# Patient Record
Sex: Male | Born: 1985 | Race: White | Hispanic: No | Marital: Single | Smoking: Current every day smoker
Health system: Southern US, Community
[De-identification: ages and names within clinical notes are randomized; demographics above are authoritative.]

## PROBLEM LIST (undated history)

## (undated) HISTORY — PX: NECK SURGERY: SHX720

## (undated) HISTORY — PX: LEG SURGERY: SHX1003

---

## 2012-02-15 ENCOUNTER — Encounter (HOSPITAL_COMMUNITY): Payer: Self-pay | Admitting: Emergency Medicine

## 2012-02-15 ENCOUNTER — Emergency Department (HOSPITAL_COMMUNITY)
Admission: EM | Admit: 2012-02-15 | Discharge: 2012-02-15 | Disposition: A | Discharge status: Home / Self Care | Payer: Self-pay | Attending: Emergency Medicine | Admitting: Emergency Medicine

## 2012-02-15 DIAGNOSIS — M25571 Pain in right ankle and joints of right foot: Secondary | ICD-10-CM

## 2012-02-15 DIAGNOSIS — M25579 Pain in unspecified ankle and joints of unspecified foot: Secondary | ICD-10-CM | POA: Insufficient documentation

## 2012-02-15 DIAGNOSIS — X500XXA Overexertion from strenuous movement or load, initial encounter: Secondary | ICD-10-CM | POA: Insufficient documentation

## 2012-02-15 MED ORDER — IBUPROFEN 800 MG PO TABS: 800.0000 mg | ORAL_TABLET | Freq: Three times a day (TID) | ORAL | Status: AC

## 2012-02-15 NOTE — ED Notes (Signed)
Pt refused xray, reports its too expensive.

## 2012-02-15 NOTE — ED Notes (Addendum)
Pt c/o right ankle pain since yesterday morning. Pt reports he hurt it while he was working, stepped over a hose and landed on it funny. Pt reports the pain in worse when he puts pressure on it. Pt reports he doesn't want an xray unless is necessary because its too expensive.

## 2012-02-18 NOTE — ED Provider Notes (Signed)
Medical screening examination/treatment/procedure(s) were performed by non-physician practitioner and as supervising physician I was immediately available for consultation/collaboration.   Megham Dwyer B. Bernette Mayers, MD 02/18/12 2149

## 2012-02-18 NOTE — ED Provider Notes (Signed)
History     CSN: [id number]  Arrival date & time 02/15/12  2023   First MD Initiated Contact with Patient 02/15/12 2032      Chief Complaint  Patient presents with  . Ankle Pain    (Consider location/radiation/quality/duration/timing/severity/associated sxs/prior treatment) HPI Hx from pt. Jeremy Avery is a 26 y.o. male  Presenting with right ankle pain. He states that yesterday morning, he stepped over a hose and "landed on it funny." States pain is worse with weightbearing although he is able to weight-bear. He does have a history of ORIF of the leg due to an ankle fracture previously. He states this was many years ago. Pain is located primarily to the medial aspect of the ankle and does not radiate. He has not noticed any swelling or deformity to the area. He denies any bruising, numbness, or weakness.  History reviewed. No pertinent past medical history.  History reviewed. No pertinent past surgical history.  No family history on file.  History  Substance Use Topics  . Smoking status: Not on file  . Smokeless tobacco: Not on file  . Alcohol Use: Not on file      Review of Systems as per history of present illness  Allergies  Review of patient's allergies indicates no known allergies.  Home Medications   Current Outpatient Rx  Name Route Sig Dispense Refill  . IBUPROFEN 800 MG PO TABS Oral Take 1 tablet (800 mg total) by mouth 3 (three) times daily. 21 tablet 0    BP 139/79  Pulse 109  Temp 98.2 F (36.8 C) (Oral)  Resp 18  SpO2 95%  Physical Exam  Nursing note and vitals reviewed. Constitutional: He appears well-developed and well-nourished. No distress.  HENT:  Head: Normocephalic and atraumatic.  Neck: Normal range of motion.  Cardiovascular: Normal rate.   Pulmonary/Chest: Effort normal.  Musculoskeletal: Normal range of motion.       Right ankle: Tender to palpation just anterior to the medial malleolus. Mild amount of edema to this area. There  is no tenderness at the malleolus itself. No tenderness over the lateral aspect. There is no focal tenderness at the head of the fifth metatarsal. No crepitus or deformity appreciated. No swelling noted. Well-healed surgical scars noted consistent with previous ORIF.  Neurological: He is alert.  Skin: Skin is warm and dry. He is not diaphoretic.  Psychiatric: He has a normal mood and affect.    ED Course  Procedures (including critical care time)  Labs Reviewed - No data to display No results found.   1. Right ankle pain       MDM  Patient presents with right ankle pain. He declines x-rays. Patient has tenderness just anterior to the medial malleolus. No swelling or deformity noted. Likely sprain. Patient instructed on RICE. Reasons to return discussed.       Grant Fontana, PA-C 02/18/12 1315

## 2015-09-09 ENCOUNTER — Emergency Department (HOSPITAL_COMMUNITY): Payer: Self-pay

## 2015-09-09 ENCOUNTER — Encounter (HOSPITAL_COMMUNITY): Payer: Self-pay | Admitting: Emergency Medicine

## 2015-09-09 ENCOUNTER — Emergency Department (HOSPITAL_COMMUNITY)
Admission: EM | Admit: 2015-09-09 | Discharge: 2015-09-09 | Disposition: A | Discharge status: Home / Self Care | Payer: Self-pay | Attending: Emergency Medicine | Admitting: Emergency Medicine

## 2015-09-09 DIAGNOSIS — K2100 Gastro-esophageal reflux disease with esophagitis, without bleeding: Secondary | ICD-10-CM

## 2015-09-09 DIAGNOSIS — K209 Esophagitis, unspecified without bleeding: Secondary | ICD-10-CM

## 2015-09-09 DIAGNOSIS — F17200 Nicotine dependence, unspecified, uncomplicated: Secondary | ICD-10-CM | POA: Insufficient documentation

## 2015-09-09 DIAGNOSIS — Z7982 Long term (current) use of aspirin: Secondary | ICD-10-CM | POA: Insufficient documentation

## 2015-09-09 LAB — CBC
HCT: 45.1 % (ref 39.0–52.0)
HEMOGLOBIN: 15.5 g/dL (ref 13.0–17.0)
MCH: 28.8 pg (ref 26.0–34.0)
MCHC: 34.4 g/dL (ref 30.0–36.0)
MCV: 83.7 fL (ref 78.0–100.0)
Platelets: 161 10*3/uL (ref 150–400)
RBC: 5.39 MIL/uL (ref 4.22–5.81)
RDW: 12.7 % (ref 11.5–15.5)
WBC: 9.2 10*3/uL (ref 4.0–10.5)

## 2015-09-09 LAB — URINALYSIS, ROUTINE W REFLEX MICROSCOPIC
Bilirubin Urine: NEGATIVE
Glucose, UA: NEGATIVE mg/dL
Hgb urine dipstick: NEGATIVE
KETONES UR: NEGATIVE mg/dL
LEUKOCYTES UA: NEGATIVE
NITRITE: NEGATIVE
PH: 6 (ref 5.0–8.0)
Protein, ur: NEGATIVE mg/dL
SPECIFIC GRAVITY, URINE: 1.03 (ref 1.005–1.030)

## 2015-09-09 LAB — BASIC METABOLIC PANEL
ANION GAP: 12 (ref 5–15)
BUN: 13 mg/dL (ref 6–20)
CHLORIDE: 104 mmol/L (ref 101–111)
CO2: 21 mmol/L — AB (ref 22–32)
Calcium: 8.9 mg/dL (ref 8.9–10.3)
Creatinine, Ser: 0.86 mg/dL (ref 0.61–1.24)
GFR calc non Af Amer: >60 mL/min (ref >60)
Glucose, Bld: 115 mg/dL — ABNORMAL HIGH (ref 65–99)
POTASSIUM: 3.7 mmol/L (ref 3.5–5.1)
Sodium: 137 mmol/L (ref 135–145)

## 2015-09-09 LAB — I-STAT TROPONIN, ED: Troponin i, poc: 0 ng/mL (ref 0.00–0.08)

## 2015-09-09 MED ORDER — SUCRALFATE 1 G PO TABS: 1.0000 g | ORAL_TABLET | Freq: Four times a day (QID) | ORAL | Status: AC

## 2015-09-09 MED ORDER — GI COCKTAIL ~~LOC~~
30.0000 mL | Freq: Once | ORAL | Status: AC
  Administered 2015-09-09: 30 mL via ORAL
  Filled 2015-09-09: qty 30

## 2015-09-09 MED ORDER — OMEPRAZOLE 20 MG PO CPDR: 20.0000 mg | DELAYED_RELEASE_CAPSULE | Freq: Two times a day (BID) | ORAL | Status: AC

## 2015-09-09 NOTE — ED Provider Notes (Signed)
CSN: [id number]     Arrival date & time 09/09/15  [id number] History   First MD Initiated Contact with Patient 09/09/15 [phone]     Chief Complaint  Patient presents with  . Chest Pain      HPI  Patient presents evaluation of chest pain that started Sunday night, 3 days ago.  He states that he is changed his diet about 2 weeks ago. Started eating just once a day, but whenever I want". He states he worked all day Sunday. And his girlfriend bought pizza. He ate 6 pieces of pizza and 2 Cokes. Proximally hour later he laid down he started feeling burning and pressure in his chest. This persisted. He got up and walk during the night. This actually helped his pain. He has not had any exertional symptoms. He has no associated shortness of breath. He admits that it makes him somewhat apprehensive but no shortness of breath. No pleuritic discomfort. No nausea or vomiting. He been taking aspirin intermittently states he'll get some short-term relief with aspirin without aspirin seems to be making it worse as well.    History reviewed. No pertinent past medical history. Past Surgical History  Procedure Laterality Date  . Neck surgery    . Leg surgery     History reviewed. No pertinent family history. Social History  Substance Use Topics  . Smoking status: Current Every Day Smoker  . Smokeless tobacco: None  . Alcohol Use: No    Review of Systems  Constitutional: Negative for fever, chills, diaphoresis, appetite change and fatigue.  HENT: Negative for mouth sores, sore throat and trouble swallowing.   Eyes: Negative for visual disturbance.  Respiratory: Positive for chest tightness. Negative for cough, shortness of breath and wheezing.   Cardiovascular: Positive for chest pain.  Gastrointestinal: Negative for nausea, vomiting, abdominal pain, diarrhea and abdominal distention.  Endocrine: Negative for polydipsia, polyphagia and polyuria.  Genitourinary: Negative for dysuria, frequency and hematuria.   Musculoskeletal: Negative for gait problem.  Skin: Negative for color change, pallor and rash.  Neurological: Negative for dizziness, syncope, light-headedness and headaches.  Hematological: Does not bruise/bleed easily.  Psychiatric/Behavioral: Negative for behavioral problems and confusion.      Allergies  Review of patient's allergies indicates no known allergies.  Home Medications   Prior to Admission medications   Medication Sig Start Date End Date Taking? Authorizing Provider  aspirin 325 MG tablet Take 975 mg by mouth once.   Yes Historical Provider, MD  omeprazole (PRILOSEC) 20 MG capsule Take 1 capsule (20 mg total) by mouth 2 (two) times daily. 09/09/15   Rolland PorterMark Reana Chacko, MD  sucralfate (CARAFATE) 1 g tablet Take 1 tablet (1 g total) by mouth 4 (four) times daily. 09/09/15   Rolland PorterMark Donyetta Ogletree, MD   BP 135/80 mmHg  Pulse 91  Temp(Src) 98.3 F (36.8 C) (Oral)  Resp 18  Ht 5\' 7"  (1.702 m)  Wt 227 lb (102.967 kg)  BMI 35.55 kg/m2  SpO2 96% Physical Exam  Constitutional: He is oriented to person, place, and time. He appears well-developed and well-nourished. No distress.  HENT:  Head: Normocephalic.  Eyes: Conjunctivae are normal. Pupils are equal, round, and reactive to light. No scleral icterus.  Neck: Normal range of motion. Neck supple. No thyromegaly present.  Cardiovascular: Normal rate and regular rhythm.  Exam reveals no gallop and no friction rub.   No murmur heard. Pulmonary/Chest: Effort normal and breath sounds normal. No respiratory distress. He has no wheezes. He has no rales.  Abdominal: Soft. Bowel sounds are normal. He exhibits no distension. There is no tenderness. There is no rebound.  Musculoskeletal: Normal range of motion.  Neurological: He is alert and oriented to person, place, and time.  Skin: Skin is warm and dry. No rash noted.  Psychiatric: He has a normal mood and affect. His behavior is normal.    ED Course  Procedures (including critical care  time) Labs Review Labs Reviewed  BASIC METABOLIC PANEL - Abnormal; Notable for the following:    CO2 21 (*)    Glucose, Bld 115 (*)    All other components within normal limits  CBC  URINALYSIS, ROUTINE W REFLEX MICROSCOPIC (NOT AT [facility])  Rosezena Sensor, ED    Imaging Review Dg Chest 2 View  09/09/2015  CLINICAL DATA:  Central chest pain, shortness of breath, and nausea for 2 days. EXAM: CHEST  2 VIEW COMPARISON:  None. FINDINGS: The heart size and mediastinal contours are within normal limits. Both lungs are clear. The visualized skeletal structures are unremarkable. IMPRESSION: No active cardiopulmonary disease. Electronically Signed   By: Burman Nieves M.D.   On: 09/09/2015 05:09   I have personally reviewed and evaluated these images and lab results as part of my medical decision-making.   EKG Interpretation   Date/Time:  Wednesday September 09 2015 08:27:10 EST Ventricular Rate:  70 PR Interval:  150 QRS Duration: 90 QT Interval:  373 QTC Calculation: 402 R Axis:   20 Text Interpretation:  Sinus rhythm Confirmed by Fayrene Fearing  MD, Ruben Mahler ([id number]) on  09/09/2015 8:39:26 AM      MDM   Final diagnoses:  Gastroesophageal reflux disease with esophagitis  Esophagitis    Patient denies cocaine or amphetamine use. He has a heart score of 1. His EKG is normal. His troponin is normal. His symptoms are classically reflux related. Plan is to avoid alcohol tobacco caffeine anti-inflammatories. Liquid antacid as needed. PPI, Carafate. Small meals. Stop binge eating.    Rolland Porter, MD 09/09/15 [phone]

## 2015-09-09 NOTE — ED Notes (Signed)
Pt. reports central chest pain with SOB and nausea onset 2 days ago , denies emesis or diaphoresis .

## 2015-09-09 NOTE — Discharge Instructions (Signed)
Smaller, more requent meals. Avoid large meals. Avoid alcohol, tobacco, anti-inflammatory medicines like aspirin, and caffeine. Liquid antacid over-the-counter as needed for any worsening pain.   Esophagitis Esophagitis is inflammation of the esophagus. The esophagus is the tube that carries food and liquids from your mouth to your stomach. Esophagitis can cause soreness or pain in the esophagus. This condition can make it difficult and painful to swallow.  CAUSES Most causes of esophagitis are not serious. Common causes of this condition include:  Gastroesophageal reflux disease (GERD). This is when stomach contents move back up into the esophagus (reflux).  Repeated vomiting.  An allergic-type reaction, especially caused by food allergies (eosinophilic esophagitis).  Injury to the esophagus by swallowing large pills with or without water, or swallowing certain types of medicines.  Swallowing (ingesting) harmful chemicals, such as household cleaning products.  Heavy alcohol use.  An infection of the esophagus.This most often occurs in people who have a weakened immune system.  Radiation or chemotherapy treatment for cancer.  Certain diseases such as sarcoidosis, Crohn disease, and scleroderma. SYMPTOMS Symptoms of this condition include:  Difficult or painful swallowing.  Pain with swallowing acidic liquids, such as citrus juices.  Pain with burping.  Chest pain.  Difficulty breathing.  Nausea.  Vomiting.  Pain in the abdomen.  Weight loss.  Ulcers in the mouth.  Patches of white material in the mouth (candidiasis).  Fever.  Coughing up blood or vomiting blood.  Stool that is black, tarry, or bright red. DIAGNOSIS Your health care provider will take a medical history and perform a physical exam. You may also have other tests, including:  An endoscopy to examine your stomach and esophagus with a small camera.  A test that measures the acidity level in  your esophagus.  A test that measures how much pressure is on your esophagus.  A barium swallow or modified barium swallow to show the shape, size, and functioning of your esophagus.  Allergy tests. TREATMENT Treatment for this condition depends on the cause of your esophagitis. In some cases, steroids or other medicines may be given to help relieve your symptoms or to treat the underlying cause of your condition. You may have to make some lifestyle changes, such as:  Avoiding alcohol.  Quitting smoking.  Changing your diet.  Exercising.  Changing your sleep habits and your sleep environment. HOME CARE INSTRUCTIONS Take these actions to decrease your discomfort and to help avoid complications. Diet  Follow a diet as recommended by your health care provider. This may involve avoiding foods and drinks such as:  Coffee and tea (with or without caffeine).  Drinks that contain alcohol.  Energy drinks and sports drinks.  Carbonated drinks or sodas.  Chocolate and cocoa.  Peppermint and mint flavorings.  Garlic and onions.  Horseradish.  Spicy and acidic foods, including peppers, chili powder, curry powder, vinegar, hot sauces, and barbecue sauce.  Citrus fruit juices and citrus fruits, such as oranges, lemons, and limes.  Tomato-based foods, such as red sauce, chili, salsa, and pizza with red sauce.  Fried and fatty foods, such as donuts, french fries, potato chips, and high-fat dressings.  High-fat meats, such as hot dogs and fatty cuts of red and white meats, such as rib eye steak, sausage, ham, and bacon.  High-fat dairy items, such as whole milk, butter, and cream cheese.  Eat small, frequent meals instead of large meals.  Avoid drinking large amounts of liquid with your meals.  Avoid eating meals during the 2-3  hours before bedtime.  Avoid lying down right after you eat.  Do not exercise right after you eat.  Avoid foods and drinks that seem to make your  symptoms worse. General Instructions  Pay attention to any changes in your symptoms.  Take over-the-counter and prescription medicines only as told by your health care provider. Do not take aspirin, ibuprofen, or other NSAIDs unless your health care provider told you to do so.  If you have trouble taking pills, use a pill splitter to decrease the size of the pill. This will decrease the chance of the pill getting stuck or injuring your esophagus on the way down. Also, drink water after you take a pill.  Do not use any tobacco products, including cigarettes, chewing tobacco, and e-cigarettes. If you need help quitting, ask your health care provider.  Wear loose-fitting clothing. Do not wear anything tight around your waist that causes pressure on your abdomen.  Raise (elevate) the head of your bed about 6 inches (15 cm).  Try to reduce your stress, such as with yoga or meditation. If you need help reducing stress, ask your health care provider.  If you are overweight, reduce your weight to an amount that is healthy for you. Ask your health care provider for guidance about a safe weight loss goal.  Keep all follow-up visits as told by your health care provider. This is important. SEEK MEDICAL CARE IF:  You have new symptoms.  You have unexplained weight loss.  You have difficulty swallowing, or it hurts to swallow.  You have wheezing or a persistent cough.  Your symptoms do not improve with treatment.  You have frequent heartburn for more than two weeks. SEEK IMMEDIATE MEDICAL CARE IF:  You have severe pain in your arms, neck, jaw, teeth, or back.  You feel sweaty, dizzy, or light-headed.  You have chest pain or shortness of breath.  You vomit and your vomit looks like blood or coffee grounds.  Your stool is bloody or black.  You have a fever.  You cannot swallow, drink, or eat.   This information is not intended to replace advice given to you by your health care  provider. Make sure you discuss any questions you have with your health care provider.   Document Released: 07/28/2004 Document Revised: 03/11/2015 Document Reviewed: 10/15/2014 Elsevier Interactive Patient Education 2016 [employer]  Gastroesophageal Reflux Disease, Adult Normally, food travels down the esophagus and stays in the stomach to be digested. If a person has gastroesophageal reflux disease (GERD), food and stomach acid move back up into the esophagus. When this happens, the esophagus becomes sore and swollen (inflamed). Over time, GERD can make small holes (ulcers) in the lining of the esophagus. HOME CARE Diet  Follow a diet as told by your doctor. You may need to avoid foods and drinks such as:  Coffee and tea (with or without caffeine).  Drinks that contain alcohol.  Energy drinks and sports drinks.  Carbonated drinks or sodas.  Chocolate and cocoa.  Peppermint and mint flavorings.  Garlic and onions.  Horseradish.  Spicy and acidic foods, such as peppers, chili powder, curry powder, vinegar, hot sauces, and BBQ sauce.  Citrus fruit juices and citrus fruits, such as oranges, lemons, and limes.  Tomato-based foods, such as red sauce, chili, salsa, and pizza with red sauce.  Fried and fatty foods, such as donuts, french fries, potato chips, and high-fat dressings.  High-fat meats, such as hot dogs, rib eye steak, sausage, ham,  and bacon.  High-fat dairy items, such as whole milk, butter, and cream cheese.  Eat small meals often. Avoid eating large meals.  Avoid drinking large amounts of liquid with your meals.  Avoid eating meals during the 2-3 hours before bedtime.  Avoid lying down right after you eat.  Do not exercise right after you eat. General Instructions  Pay attention to any changes in your symptoms.  Take over-the-counter and prescription medicines only as told by your doctor. Do not take aspirin, ibuprofen, or other NSAIDs unless your  doctor says it is okay.  Do not use any tobacco products, including cigarettes, chewing tobacco, and e-cigarettes. If you need help quitting, ask your doctor.  Wear loose clothes. Do not wear anything tight around your waist.  Raise (elevate) the head of your bed about 6 inches (15 cm).  Try to lower your stress. If you need help doing this, ask your doctor.  If you are overweight, lose an amount of weight that is healthy for you. Ask your doctor about a safe weight loss goal.  Keep all follow-up visits as told by your doctor. This is important. GET HELP IF:  You have new symptoms.  You lose weight and you do not know why it is happening.  You have trouble swallowing, or it hurts to swallow.  You have wheezing or a cough that keeps happening.  Your symptoms do not get better with treatment.  You have a hoarse voice. GET HELP RIGHT AWAY IF:  You have pain in your arms, neck, jaw, teeth, or back.  You feel sweaty, dizzy, or light-headed.  You have chest pain or shortness of breath.  You throw up (vomit) and your throw up looks like blood or coffee grounds.  You pass out (faint).  Your poop (stool) is bloody or black.  You cannot swallow, drink, or eat.   This information is not intended to replace advice given to you by your health care provider. Make sure you discuss any questions you have with your health care provider.   Document Released: 12/07/2007 Document Revised: 03/11/2015 Document Reviewed: 10/15/2014 Elsevier Interactive Patient Education [employer] Inc[employer]

## 2017-05-15 IMAGING — CR DG CHEST 2V
2 series · 2 of 2 positions shown · non-contrast
Comparison: None.

CLINICAL DATA: Central chest pain, shortness of breath, and nausea
for 2 days.

EXAM:
CHEST  2 VIEW

[chest pa]
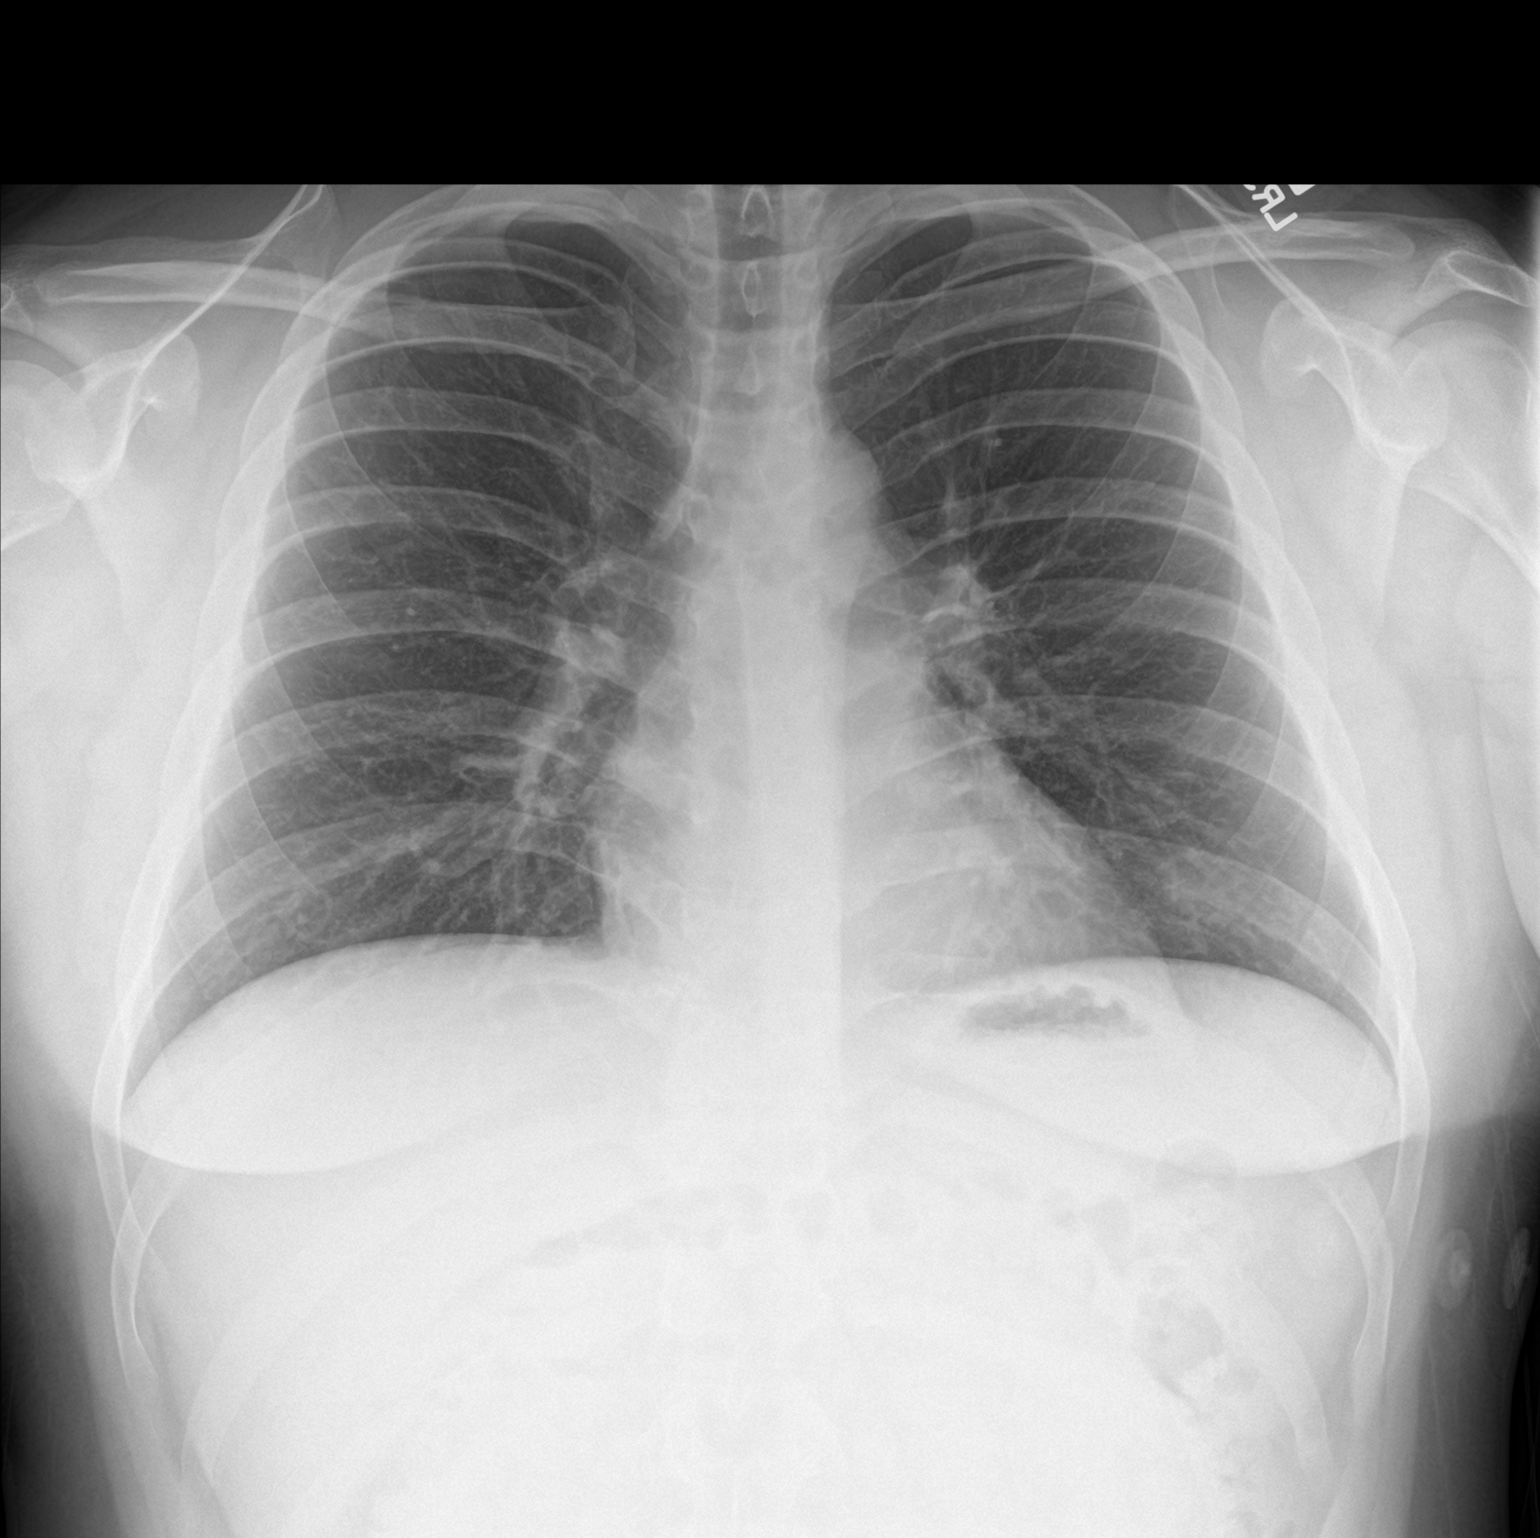

[chest lat]
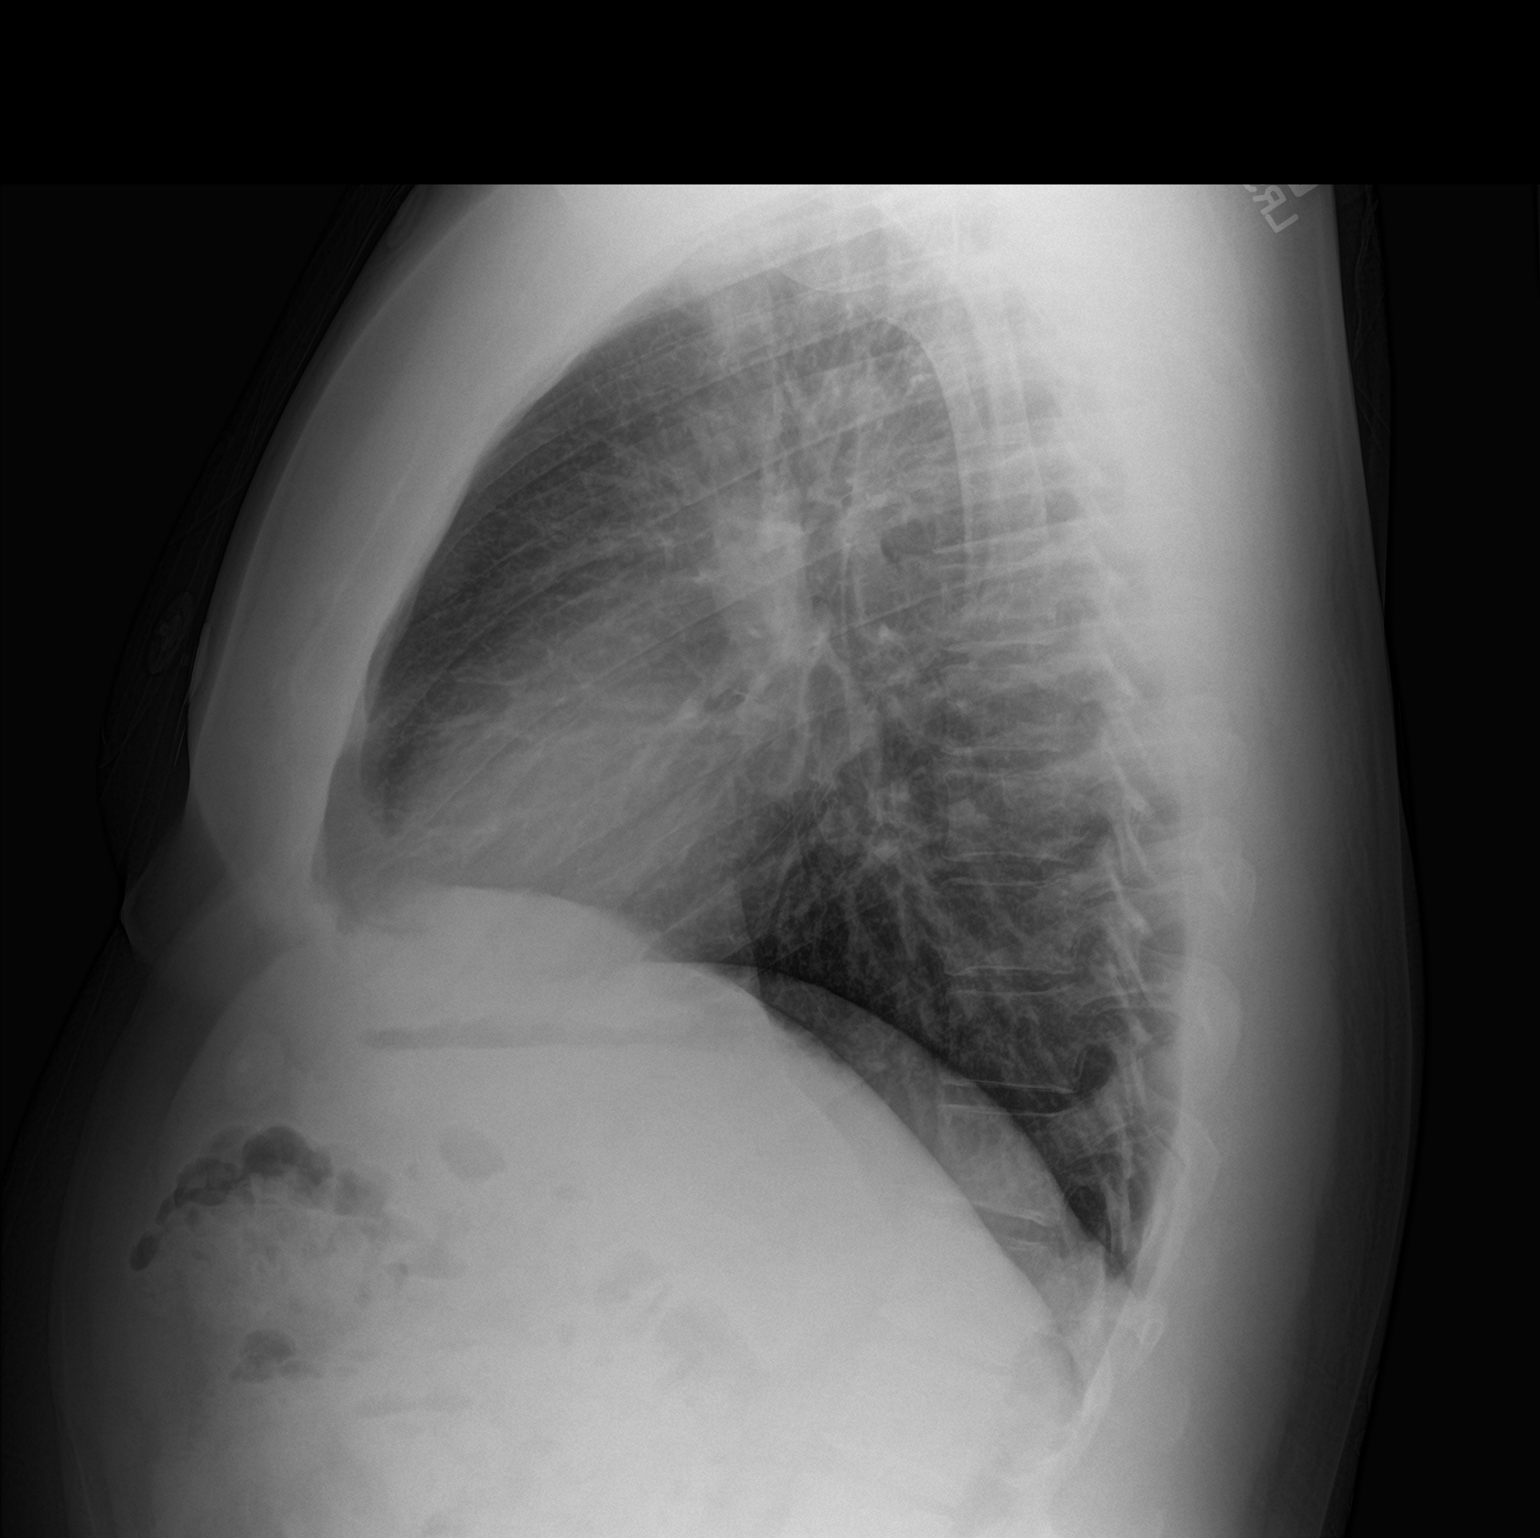

[2 of 2 positions shown; findings below may reference images not displayed]

FINDINGS: The heart size and mediastinal contours are within normal limits.
Both lungs are clear. The visualized skeletal structures are
unremarkable.
IMPRESSION: No active cardiopulmonary disease.
